# Patient Record
Sex: Male | Born: 1935 | Race: White | Hispanic: No | Marital: Married
Health system: Southern US, Community
[De-identification: ages and names within clinical notes are randomized; demographics above are authoritative.]

---

## 1998-12-21 ENCOUNTER — Encounter: Payer: Self-pay | Admitting: *Deleted

## 1998-12-21 ENCOUNTER — Emergency Department (HOSPITAL_COMMUNITY): Admission: EM | Admit: 1998-12-21 | Discharge: 1998-12-21 | Payer: Self-pay | Admitting: Emergency Medicine

## 2001-09-06 ENCOUNTER — Ambulatory Visit (HOSPITAL_COMMUNITY): Admission: RE | Admit: 2001-09-06 | Discharge: 2001-09-06 | Payer: Self-pay | Admitting: Gastroenterology

## 2002-12-12 ENCOUNTER — Ambulatory Visit (HOSPITAL_COMMUNITY): Admission: RE | Admit: 2002-12-12 | Discharge: 2002-12-13 | Payer: Self-pay | Admitting: Interventional Cardiology

## 2002-12-15 ENCOUNTER — Encounter: Payer: Self-pay | Admitting: Emergency Medicine

## 2002-12-15 ENCOUNTER — Observation Stay (HOSPITAL_COMMUNITY): Admission: EM | Admit: 2002-12-15 | Discharge: 2002-12-16 | Payer: Self-pay | Admitting: Emergency Medicine

## 2004-01-01 ENCOUNTER — Encounter: Admission: RE | Admit: 2004-01-01 | Discharge: 2004-01-01 | Payer: Self-pay | Admitting: Rheumatology

## 2004-02-06 ENCOUNTER — Ambulatory Visit (HOSPITAL_COMMUNITY): Admission: RE | Admit: 2004-02-06 | Discharge: 2004-02-06 | Payer: Self-pay | Admitting: Urology

## 2004-02-10 ENCOUNTER — Ambulatory Visit: Admission: RE | Admit: 2004-02-10 | Discharge: 2004-05-13 | Payer: Self-pay | Admitting: Radiation Oncology

## 2004-06-11 ENCOUNTER — Ambulatory Visit: Admission: RE | Admit: 2004-06-11 | Discharge: 2004-06-11 | Payer: Self-pay | Admitting: Radiation Oncology

## 2005-02-01 ENCOUNTER — Encounter: Admission: RE | Admit: 2005-02-01 | Discharge: 2005-02-01 | Payer: Self-pay | Admitting: Nephrology

## 2005-03-25 ENCOUNTER — Encounter: Admission: RE | Admit: 2005-03-25 | Discharge: 2005-03-25 | Payer: Self-pay | Admitting: Nephrology

## 2006-04-20 ENCOUNTER — Encounter: Admission: RE | Admit: 2006-04-20 | Discharge: 2006-04-20 | Payer: Self-pay | Admitting: Nephrology

## 2007-02-20 ENCOUNTER — Encounter: Admission: RE | Admit: 2007-02-20 | Discharge: 2007-02-20 | Payer: Self-pay | Admitting: Gastroenterology

## 2008-04-22 ENCOUNTER — Encounter: Admission: RE | Admit: 2008-04-22 | Discharge: 2008-04-22 | Payer: Self-pay | Admitting: Gastroenterology

## 2008-05-15 ENCOUNTER — Inpatient Hospital Stay (HOSPITAL_COMMUNITY): Admission: RE | Admit: 2008-05-15 | Discharge: 2008-05-21 | Payer: Self-pay | Admitting: Surgery

## 2008-05-15 ENCOUNTER — Encounter (INDEPENDENT_AMBULATORY_CARE_PROVIDER_SITE_OTHER): Payer: Self-pay | Admitting: Surgery

## 2008-05-22 ENCOUNTER — Ambulatory Visit: Payer: Self-pay | Admitting: Oncology

## 2008-06-04 ENCOUNTER — Ambulatory Visit (HOSPITAL_COMMUNITY): Admission: RE | Admit: 2008-06-04 | Discharge: 2008-06-04 | Payer: Self-pay | Admitting: Surgery

## 2008-06-17 LAB — COMPREHENSIVE METABOLIC PANEL
Albumin: 4.2 g/dL (ref 3.5–5.2)
Alkaline Phosphatase: 78 U/L (ref 39–117)
CO2: 28 mEq/L (ref 19–32)
Glucose, Bld: 132 mg/dL — ABNORMAL HIGH (ref 70–99)
Potassium: 3.5 mEq/L (ref 3.5–5.3)
Sodium: 142 mEq/L (ref 135–145)
Total Protein: 6.8 g/dL (ref 6.0–8.3)

## 2008-06-17 LAB — CBC WITH DIFFERENTIAL/PLATELET
Eosinophils Absolute: 0.1 10*3/uL (ref 0.0–0.5)
MONO#: 0.4 10*3/uL (ref 0.1–0.9)
NEUT#: 4.1 10*3/uL (ref 1.5–6.5)
RBC: 4.75 10*6/uL (ref 4.20–5.82)
RDW: 15.8 % — ABNORMAL HIGH (ref 11.0–14.6)
WBC: 5.8 10*3/uL (ref 4.0–10.3)

## 2008-07-02 LAB — CBC WITH DIFFERENTIAL/PLATELET
Basophils Absolute: 0.1 10*3/uL (ref 0.0–0.1)
EOS%: 6.9 % (ref 0.0–7.0)
Eosinophils Absolute: 0.4 10*3/uL (ref 0.0–0.5)
HCT: 40.4 % (ref 38.4–49.9)
HGB: 13.9 g/dL (ref 13.0–17.1)
LYMPH%: 28.1 % (ref 14.0–49.0)
MCH: 27.9 pg (ref 27.2–33.4)
MCV: 81.1 fL (ref 79.3–98.0)
MONO%: 9.5 % (ref 0.0–14.0)
NEUT#: 2.8 10*3/uL (ref 1.5–6.5)
NEUT%: 54.5 % (ref 39.0–75.0)
Platelets: 132 10*3/uL — ABNORMAL LOW (ref 140–400)
RDW: 15.1 % — ABNORMAL HIGH (ref 11.0–14.6)

## 2008-07-02 LAB — COMPREHENSIVE METABOLIC PANEL
Albumin: 3.9 g/dL (ref 3.5–5.2)
Alkaline Phosphatase: 69 U/L (ref 39–117)
BUN: 14 mg/dL (ref 6–23)
CO2: 29 mEq/L (ref 19–32)
Glucose, Bld: 104 mg/dL — ABNORMAL HIGH (ref 70–99)
Sodium: 140 mEq/L (ref 135–145)
Total Bilirubin: 1 mg/dL (ref 0.3–1.2)
Total Protein: 6.8 g/dL (ref 6.0–8.3)

## 2008-07-11 ENCOUNTER — Ambulatory Visit: Payer: Self-pay | Admitting: Oncology

## 2008-07-15 LAB — CBC WITH DIFFERENTIAL/PLATELET
Basophils Absolute: 0 10*3/uL (ref 0.0–0.1)
Eosinophils Absolute: 0.2 10*3/uL (ref 0.0–0.5)
HGB: 14.4 g/dL (ref 13.0–17.1)
MONO#: 0.5 10*3/uL (ref 0.1–0.9)
NEUT#: 3.2 10*3/uL (ref 1.5–6.5)
Platelets: 118 10*3/uL — ABNORMAL LOW (ref 140–400)
RBC: 5.07 10*6/uL (ref 4.20–5.82)
RDW: 16.1 % — ABNORMAL HIGH (ref 11.0–14.6)
WBC: 5.4 10*3/uL (ref 4.0–10.3)
nRBC: 0 % (ref 0–0)

## 2008-07-15 LAB — COMPREHENSIVE METABOLIC PANEL
ALT: 24 U/L (ref 0–53)
Albumin: 3.7 g/dL (ref 3.5–5.2)
CO2: 27 mEq/L (ref 19–32)
Calcium: 9.1 mg/dL (ref 8.4–10.5)
Chloride: 104 mEq/L (ref 96–112)
Glucose, Bld: 128 mg/dL — ABNORMAL HIGH (ref 70–99)
Sodium: 141 mEq/L (ref 135–145)
Total Protein: 6.8 g/dL (ref 6.0–8.3)

## 2008-07-29 LAB — CBC WITH DIFFERENTIAL/PLATELET
Basophils Absolute: 0 10*3/uL (ref 0.0–0.1)
EOS%: 1.7 % (ref 0.0–7.0)
HGB: 13.2 g/dL (ref 13.0–17.1)
MCH: 29.1 pg (ref 27.2–33.4)
MCHC: 35.2 g/dL (ref 32.0–36.0)
MCV: 82.8 fL (ref 79.3–98.0)
MONO%: 9.9 % (ref 0.0–14.0)
RDW: 17.5 % — ABNORMAL HIGH (ref 11.0–14.6)

## 2008-07-29 LAB — COMPREHENSIVE METABOLIC PANEL
ALT: 19 U/L (ref 0–53)
AST: 24 U/L (ref 0–37)
Albumin: 3.7 g/dL (ref 3.5–5.2)
Alkaline Phosphatase: 68 U/L (ref 39–117)
BUN: 19 mg/dL (ref 6–23)
Potassium: 3.1 mEq/L — ABNORMAL LOW (ref 3.5–5.3)
Sodium: 140 mEq/L (ref 135–145)
Total Protein: 6.6 g/dL (ref 6.0–8.3)

## 2008-08-12 LAB — CBC WITH DIFFERENTIAL/PLATELET
BASO%: 1 % (ref 0.0–2.0)
Basophils Absolute: 0.1 10e3/uL (ref 0.0–0.1)
EOS%: 4.1 % (ref 0.0–7.0)
Eosinophils Absolute: 0.2 10e3/uL (ref 0.0–0.5)
HCT: 38.9 % (ref 38.4–49.9)
HGB: 13.7 g/dL (ref 13.0–17.1)
LYMPH%: 25.3 % (ref 14.0–49.0)
MCH: 29.8 pg (ref 27.2–33.4)
MCHC: 35.2 g/dL (ref 32.0–36.0)
MCV: 84.6 fL (ref 79.3–98.0)
MONO#: 0.5 10e3/uL (ref 0.1–0.9)
MONO%: 7.7 % (ref 0.0–14.0)
NEUT#: 3.6 10e3/uL (ref 1.5–6.5)
NEUT%: 61.9 % (ref 39.0–75.0)
Platelets: 137 10e3/uL — ABNORMAL LOW (ref 140–400)
RBC: 4.6 10e6/uL (ref 4.20–5.82)
RDW: 18.2 % — ABNORMAL HIGH (ref 11.0–14.6)
WBC: 5.9 10e3/uL (ref 4.0–10.3)
lymph#: 1.5 10e3/uL (ref 0.9–3.3)

## 2008-08-22 ENCOUNTER — Ambulatory Visit: Payer: Self-pay | Admitting: Oncology

## 2008-08-26 LAB — CBC WITH DIFFERENTIAL/PLATELET
BASO%: 0.2 % (ref 0.0–2.0)
EOS%: 0.4 % (ref 0.0–7.0)
MCH: 29.9 pg (ref 27.2–33.4)
MCHC: 35.2 g/dL (ref 32.0–36.0)
MONO#: 0.6 10*3/uL (ref 0.1–0.9)
RBC: 4.52 10*6/uL (ref 4.20–5.82)
RDW: 18 % — ABNORMAL HIGH (ref 11.0–14.6)
WBC: 9.6 10*3/uL (ref 4.0–10.3)
lymph#: 1.6 10*3/uL (ref 0.9–3.3)
nRBC: 0 % (ref 0–0)

## 2008-09-17 LAB — CBC WITH DIFFERENTIAL/PLATELET
Basophils Absolute: 0 10*3/uL (ref 0.0–0.1)
Eosinophils Absolute: 0.2 10*3/uL (ref 0.0–0.5)
HCT: 38.9 % (ref 38.4–49.9)
HGB: 13.5 g/dL (ref 13.0–17.1)
MONO#: 0.4 10*3/uL (ref 0.1–0.9)
NEUT#: 2.9 10*3/uL (ref 1.5–6.5)
NEUT%: 60.2 % (ref 39.0–75.0)
WBC: 4.8 10*3/uL (ref 4.0–10.3)
lymph#: 1.3 10*3/uL (ref 0.9–3.3)

## 2008-09-17 LAB — COMPREHENSIVE METABOLIC PANEL
ALT: 17 U/L (ref 0–53)
Albumin: 3.8 g/dL (ref 3.5–5.2)
BUN: 26 mg/dL — ABNORMAL HIGH (ref 6–23)
CO2: 25 mEq/L (ref 19–32)
Calcium: 8.8 mg/dL (ref 8.4–10.5)
Chloride: 104 mEq/L (ref 96–112)
Creatinine, Ser: 1.36 mg/dL (ref 0.40–1.50)

## 2008-09-30 LAB — BASIC METABOLIC PANEL
CO2: 23 mEq/L (ref 19–32)
Chloride: 105 mEq/L (ref 96–112)
Creatinine, Ser: 1.34 mg/dL (ref 0.40–1.50)
Potassium: 3.9 mEq/L (ref 3.5–5.3)

## 2008-09-30 LAB — CBC WITH DIFFERENTIAL/PLATELET
BASO%: 0.1 % (ref 0.0–2.0)
HCT: 38.6 % (ref 38.4–49.9)
HGB: 13.5 g/dL (ref 13.0–17.1)
MCHC: 35 g/dL (ref 32.0–36.0)
MONO#: 0.5 10*3/uL (ref 0.1–0.9)
NEUT%: 62.8 % (ref 39.0–75.0)
RDW: 18.2 % — ABNORMAL HIGH (ref 11.0–14.6)
WBC: 6.9 10*3/uL (ref 4.0–10.3)
lymph#: 2 10*3/uL (ref 0.9–3.3)

## 2008-10-10 ENCOUNTER — Ambulatory Visit: Payer: Self-pay | Admitting: Oncology

## 2008-10-14 LAB — CBC WITH DIFFERENTIAL/PLATELET
Basophils Absolute: 0 10*3/uL (ref 0.0–0.1)
EOS%: 1.2 % (ref 0.0–7.0)
Eosinophils Absolute: 0.1 10*3/uL (ref 0.0–0.5)
HCT: 40.4 % (ref 38.4–49.9)
HGB: 14 g/dL (ref 13.0–17.1)
LYMPH%: 24.7 % (ref 14.0–49.0)
MCH: 31.3 pg (ref 27.2–33.4)
MCV: 90.4 fL (ref 79.3–98.0)
MONO%: 7.6 % (ref 0.0–14.0)
NEUT#: 5.1 10*3/uL (ref 1.5–6.5)
NEUT%: 66.1 % (ref 39.0–75.0)
Platelets: 123 10*3/uL — ABNORMAL LOW (ref 140–400)
RDW: 18.1 % — ABNORMAL HIGH (ref 11.0–14.6)

## 2008-10-28 LAB — CBC WITH DIFFERENTIAL/PLATELET
Basophils Absolute: 0 10*3/uL (ref 0.0–0.1)
Eosinophils Absolute: 0.1 10*3/uL (ref 0.0–0.5)
HCT: 39.4 % (ref 38.4–49.9)
HGB: 13.4 g/dL (ref 13.0–17.1)
MCH: 31.9 pg (ref 27.2–33.4)
MCV: 93.6 fL (ref 79.3–98.0)
MONO%: 6.2 % (ref 0.0–14.0)
NEUT#: 3.6 10*3/uL (ref 1.5–6.5)
NEUT%: 65.4 % (ref 39.0–75.0)
RDW: 18 % — ABNORMAL HIGH (ref 11.0–14.6)

## 2008-10-28 LAB — COMPREHENSIVE METABOLIC PANEL
Albumin: 4.2 g/dL (ref 3.5–5.2)
Alkaline Phosphatase: 54 U/L (ref 39–117)
BUN: 16 mg/dL (ref 6–23)
Calcium: 9.4 mg/dL (ref 8.4–10.5)
Chloride: 105 mEq/L (ref 96–112)
Creatinine, Ser: 1.36 mg/dL (ref 0.40–1.50)
Glucose, Bld: 111 mg/dL — ABNORMAL HIGH (ref 70–99)
Potassium: 4.2 mEq/L (ref 3.5–5.3)

## 2008-11-08 ENCOUNTER — Ambulatory Visit: Payer: Self-pay | Admitting: Oncology

## 2008-11-11 LAB — CBC WITH DIFFERENTIAL/PLATELET
Basophils Absolute: 0 10*3/uL (ref 0.0–0.1)
Eosinophils Absolute: 0.2 10*3/uL (ref 0.0–0.5)
HCT: 41.7 % (ref 38.4–49.9)
HGB: 14.4 g/dL (ref 13.0–17.1)
NEUT#: 4.4 10*3/uL (ref 1.5–6.5)
NEUT%: 62.1 % (ref 39.0–75.0)
RDW: 16.8 % — ABNORMAL HIGH (ref 11.0–14.6)
lymph#: 2 10*3/uL (ref 0.9–3.3)

## 2008-11-25 LAB — URINALYSIS, MICROSCOPIC - CHCC
Bilirubin (Urine): NEGATIVE
Blood: NEGATIVE
Ketones: NEGATIVE mg/dL
Protein: NEGATIVE mg/dL
Specific Gravity, Urine: 1.01 (ref 1.003–1.035)
pH: 6 (ref 4.6–8.0)

## 2008-11-25 LAB — COMPREHENSIVE METABOLIC PANEL
AST: 21 U/L (ref 0–37)
Albumin: 4 g/dL (ref 3.5–5.2)
Alkaline Phosphatase: 67 U/L (ref 39–117)
BUN: 14 mg/dL (ref 6–23)
Creatinine, Ser: 1.29 mg/dL (ref 0.40–1.50)
Glucose, Bld: 123 mg/dL — ABNORMAL HIGH (ref 70–99)
Potassium: 4.2 mEq/L (ref 3.5–5.3)
Total Bilirubin: 0.7 mg/dL (ref 0.3–1.2)

## 2008-11-25 LAB — CBC WITH DIFFERENTIAL/PLATELET
Basophils Absolute: 0.1 10*3/uL (ref 0.0–0.1)
EOS%: 2.5 % (ref 0.0–7.0)
Eosinophils Absolute: 0.2 10*3/uL (ref 0.0–0.5)
HGB: 14.2 g/dL (ref 13.0–17.1)
LYMPH%: 18.2 % (ref 14.0–49.0)
MCH: 31 pg (ref 27.2–33.4)
MCV: 91.3 fL (ref 79.3–98.0)
MONO%: 6.3 % (ref 0.0–14.0)
NEUT#: 5.8 10*3/uL (ref 1.5–6.5)
Platelets: 140 10*3/uL (ref 140–400)
RDW: 16.1 % — ABNORMAL HIGH (ref 11.0–14.6)

## 2009-01-02 ENCOUNTER — Ambulatory Visit: Payer: Self-pay | Admitting: Oncology

## 2009-01-23 ENCOUNTER — Encounter: Admission: RE | Admit: 2009-01-23 | Discharge: 2009-01-23 | Payer: Self-pay | Admitting: Gastroenterology

## 2009-01-24 LAB — COMPREHENSIVE METABOLIC PANEL
ALT: 29 U/L (ref 0–53)
BUN: 10 mg/dL (ref 6–23)
CO2: 23 mEq/L (ref 19–32)
Calcium: 9.5 mg/dL (ref 8.4–10.5)
Chloride: 105 mEq/L (ref 96–112)
Creatinine, Ser: 1.12 mg/dL (ref 0.40–1.50)
Glucose, Bld: 102 mg/dL — ABNORMAL HIGH (ref 70–99)
Total Bilirubin: 0.5 mg/dL (ref 0.3–1.2)

## 2009-01-24 LAB — CEA: CEA: 1 ng/mL (ref 0.0–5.0)

## 2009-01-24 LAB — CBC WITH DIFFERENTIAL/PLATELET
BASO%: 0.2 % (ref 0.0–2.0)
Basophils Absolute: 0 10*3/uL (ref 0.0–0.1)
Eosinophils Absolute: 0.1 10*3/uL (ref 0.0–0.5)
HCT: 38.1 % — ABNORMAL LOW (ref 38.4–49.9)
HGB: 13 g/dL (ref 13.0–17.1)
LYMPH%: 14.8 % (ref 14.0–49.0)
MCHC: 34.2 g/dL (ref 32.0–36.0)
MONO#: 0.5 10*3/uL (ref 0.1–0.9)
NEUT%: 76 % — ABNORMAL HIGH (ref 39.0–75.0)
Platelets: 226 10*3/uL (ref 140–400)
WBC: 6.9 10*3/uL (ref 4.0–10.3)
lymph#: 1 10*3/uL (ref 0.9–3.3)

## 2009-02-04 ENCOUNTER — Ambulatory Visit: Payer: Self-pay | Admitting: Oncology

## 2009-02-06 LAB — CBC WITH DIFFERENTIAL/PLATELET
BASO%: 0.6 % (ref 0.0–2.0)
Basophils Absolute: 0.1 10*3/uL (ref 0.0–0.1)
EOS%: 2.1 % (ref 0.0–7.0)
HCT: 39.2 % (ref 38.4–49.9)
HGB: 12.7 g/dL — ABNORMAL LOW (ref 13.0–17.1)
MCH: 28.1 pg (ref 27.2–33.4)
MONO#: 0.7 10*3/uL (ref 0.1–0.9)
NEUT#: 5.8 10*3/uL (ref 1.5–6.5)
NEUT%: 72.3 % (ref 39.0–75.0)
RDW: 14.7 % — ABNORMAL HIGH (ref 11.0–14.6)
WBC: 8.1 10*3/uL (ref 4.0–10.3)
lymph#: 1.3 10*3/uL (ref 0.9–3.3)

## 2009-02-19 LAB — COMPREHENSIVE METABOLIC PANEL
ALT: 14 U/L (ref 0–53)
BUN: 13 mg/dL (ref 6–23)
CO2: 23 mEq/L (ref 19–32)
Calcium: 9 mg/dL (ref 8.4–10.5)
Chloride: 104 mEq/L (ref 96–112)
Creatinine, Ser: 1.25 mg/dL (ref 0.40–1.50)
Total Bilirubin: 0.6 mg/dL (ref 0.3–1.2)

## 2009-02-19 LAB — CBC WITH DIFFERENTIAL/PLATELET
BASO%: 0.4 % (ref 0.0–2.0)
Eosinophils Absolute: 0.3 10*3/uL (ref 0.0–0.5)
HCT: 37.8 % — ABNORMAL LOW (ref 38.4–49.9)
LYMPH%: 13.4 % — ABNORMAL LOW (ref 14.0–49.0)
MCHC: 33 g/dL (ref 32.0–36.0)
MCV: 86.3 fL (ref 79.3–98.0)
MONO%: 7.3 % (ref 0.0–14.0)
NEUT%: 75.7 % — ABNORMAL HIGH (ref 39.0–75.0)
Platelets: 243 10*3/uL (ref 140–400)
RBC: 4.38 10*6/uL (ref 4.20–5.82)

## 2009-02-19 LAB — CEA: CEA: 0.9 ng/mL (ref 0.0–5.0)

## 2009-05-22 ENCOUNTER — Ambulatory Visit: Payer: Self-pay | Admitting: Internal Medicine

## 2009-05-23 ENCOUNTER — Ambulatory Visit: Payer: Self-pay | Admitting: Oncology

## 2009-05-23 ENCOUNTER — Inpatient Hospital Stay (HOSPITAL_COMMUNITY): Admission: EM | Admit: 2009-05-23 | Discharge: 2009-06-06 | Payer: Self-pay | Admitting: Emergency Medicine

## 2009-06-16 ENCOUNTER — Ambulatory Visit: Payer: Self-pay | Admitting: Pulmonary Disease

## 2009-06-16 ENCOUNTER — Inpatient Hospital Stay (HOSPITAL_COMMUNITY): Admission: EM | Admit: 2009-06-16 | Discharge: 2009-06-25 | Payer: Self-pay | Admitting: Emergency Medicine

## 2009-07-18 DEATH — deceased

## 2010-05-10 ENCOUNTER — Encounter: Payer: Self-pay | Admitting: Oncology

## 2010-07-09 LAB — COMPREHENSIVE METABOLIC PANEL
ALT: 15 U/L (ref 0–53)
ALT: 18 U/L (ref 0–53)
ALT: 24 U/L (ref 0–53)
ALT: 33 U/L (ref 0–53)
AST: 25 U/L (ref 0–37)
AST: 25 U/L (ref 0–37)
Albumin: 2.2 g/dL — ABNORMAL LOW (ref 3.5–5.2)
Albumin: 3.2 g/dL — ABNORMAL LOW (ref 3.5–5.2)
Alkaline Phosphatase: 113 U/L (ref 39–117)
Alkaline Phosphatase: 130 U/L — ABNORMAL HIGH (ref 39–117)
Alkaline Phosphatase: 150 U/L — ABNORMAL HIGH (ref 39–117)
Alkaline Phosphatase: 97 U/L (ref 39–117)
BUN: 18 mg/dL (ref 6–23)
BUN: 33 mg/dL — ABNORMAL HIGH (ref 6–23)
BUN: 57 mg/dL — ABNORMAL HIGH (ref 6–23)
CO2: 20 mEq/L (ref 19–32)
CO2: 24 mEq/L (ref 19–32)
Calcium: 11 mg/dL — ABNORMAL HIGH (ref 8.4–10.5)
Calcium: 8.6 mg/dL (ref 8.4–10.5)
Calcium: 9.4 mg/dL (ref 8.4–10.5)
Chloride: 109 mEq/L (ref 96–112)
Chloride: 120 mEq/L — ABNORMAL HIGH (ref 96–112)
Creatinine, Ser: 1.72 mg/dL — ABNORMAL HIGH (ref 0.4–1.5)
GFR calc Af Amer: 47 mL/min — ABNORMAL LOW (ref >60)
GFR calc Af Amer: >60 mL/min (ref >60)
GFR calc non Af Amer: 55 mL/min — ABNORMAL LOW (ref >60)
GFR calc non Af Amer: 58 mL/min — ABNORMAL LOW (ref >60)
Glucose, Bld: 115 mg/dL — ABNORMAL HIGH (ref 70–99)
Glucose, Bld: 122 mg/dL — ABNORMAL HIGH (ref 70–99)
Glucose, Bld: 127 mg/dL — ABNORMAL HIGH (ref 70–99)
Potassium: 3.5 mEq/L (ref 3.5–5.1)
Potassium: 3.9 mEq/L (ref 3.5–5.1)
Potassium: 4.1 mEq/L (ref 3.5–5.1)
Sodium: 129 mEq/L — ABNORMAL LOW (ref 135–145)
Sodium: 138 mEq/L (ref 135–145)
Sodium: 143 mEq/L (ref 135–145)
Total Bilirubin: 0.2 mg/dL — ABNORMAL LOW (ref 0.3–1.2)
Total Bilirubin: 0.8 mg/dL (ref 0.3–1.2)
Total Protein: 5.7 g/dL — ABNORMAL LOW (ref 6.0–8.3)
Total Protein: 6.5 g/dL (ref 6.0–8.3)
Total Protein: 6.8 g/dL (ref 6.0–8.3)
Total Protein: 8 g/dL (ref 6.0–8.3)

## 2010-07-09 LAB — CBC
HCT: 24.9 % — ABNORMAL LOW (ref 39.0–52.0)
HCT: 25.6 % — ABNORMAL LOW (ref 39.0–52.0)
HCT: 26 % — ABNORMAL LOW (ref 39.0–52.0)
HCT: 27.3 % — ABNORMAL LOW (ref 39.0–52.0)
HCT: 27.5 % — ABNORMAL LOW (ref 39.0–52.0)
HCT: 27.7 % — ABNORMAL LOW (ref 39.0–52.0)
HCT: 28.8 % — ABNORMAL LOW (ref 39.0–52.0)
HCT: 31.6 % — ABNORMAL LOW (ref 39.0–52.0)
HCT: 48 % (ref 39.0–52.0)
Hemoglobin: 10.4 g/dL — ABNORMAL LOW (ref 13.0–17.0)
Hemoglobin: 8.6 g/dL — ABNORMAL LOW (ref 13.0–17.0)
Hemoglobin: 9.1 g/dL — ABNORMAL LOW (ref 13.0–17.0)
Hemoglobin: 9.4 g/dL — ABNORMAL LOW (ref 13.0–17.0)
MCHC: 31.7 g/dL (ref 30.0–36.0)
MCHC: 33 g/dL (ref 30.0–36.0)
MCHC: 33.4 g/dL (ref 30.0–36.0)
MCHC: 33.5 g/dL (ref 30.0–36.0)
MCHC: 33.5 g/dL (ref 30.0–36.0)
MCHC: 33.6 g/dL (ref 30.0–36.0)
MCHC: 33.6 g/dL (ref 30.0–36.0)
MCV: 79.2 fL (ref 78.0–100.0)
MCV: 80 fL (ref 78.0–100.0)
MCV: 80.1 fL (ref 78.0–100.0)
MCV: 80.2 fL (ref 78.0–100.0)
MCV: 80.2 fL (ref 78.0–100.0)
MCV: 80.4 fL (ref 78.0–100.0)
MCV: 81.1 fL (ref 78.0–100.0)
Platelets: 158 10*3/uL (ref 150–400)
Platelets: 216 10*3/uL (ref 150–400)
Platelets: 237 10*3/uL (ref 150–400)
Platelets: 245 10*3/uL (ref 150–400)
Platelets: 267 10*3/uL (ref 150–400)
Platelets: 276 10*3/uL (ref 150–400)
Platelets: 309 10*3/uL (ref 150–400)
Platelets: 319 10*3/uL (ref 150–400)
Platelets: 560 K/uL — ABNORMAL HIGH (ref 150–400)
RBC: 3.15 MIL/uL — ABNORMAL LOW (ref 4.22–5.81)
RBC: 3.3 MIL/uL — ABNORMAL LOW (ref 4.22–5.81)
RBC: 3.34 MIL/uL — ABNORMAL LOW (ref 4.22–5.81)
RBC: 3.43 MIL/uL — ABNORMAL LOW (ref 4.22–5.81)
RBC: 3.43 MIL/uL — ABNORMAL LOW (ref 4.22–5.81)
RBC: 3.53 MIL/uL — ABNORMAL LOW (ref 4.22–5.81)
RBC: 3.63 MIL/uL — ABNORMAL LOW (ref 4.22–5.81)
RBC: 3.69 MIL/uL — ABNORMAL LOW (ref 4.22–5.81)
RDW: 17.4 % — ABNORMAL HIGH (ref 11.5–15.5)
RDW: 17.4 % — ABNORMAL HIGH (ref 11.5–15.5)
RDW: 17.5 % — ABNORMAL HIGH (ref 11.5–15.5)
RDW: 17.6 % — ABNORMAL HIGH (ref 11.5–15.5)
RDW: 17.8 % — ABNORMAL HIGH (ref 11.5–15.5)
RDW: 17.9 % — ABNORMAL HIGH (ref 11.5–15.5)
RDW: 18 % — ABNORMAL HIGH (ref 11.5–15.5)
RDW: 18.2 % — ABNORMAL HIGH (ref 11.5–15.5)
RDW: 18.2 % — ABNORMAL HIGH (ref 11.5–15.5)
RDW: 18.2 % — ABNORMAL HIGH (ref 11.5–15.5)
RDW: 18.7 % — ABNORMAL HIGH (ref 11.5–15.5)
WBC: 10.6 10*3/uL — ABNORMAL HIGH (ref 4.0–10.5)
WBC: 11.3 10*3/uL — ABNORMAL HIGH (ref 4.0–10.5)
WBC: 12.7 10*3/uL — ABNORMAL HIGH (ref 4.0–10.5)
WBC: 15.4 10*3/uL — ABNORMAL HIGH (ref 4.0–10.5)
WBC: 16.6 10*3/uL — ABNORMAL HIGH (ref 4.0–10.5)
WBC: 26.6 K/uL — ABNORMAL HIGH (ref 4.0–10.5)
WBC: 7.4 10*3/uL (ref 4.0–10.5)
WBC: 7.6 10*3/uL (ref 4.0–10.5)
WBC: 9 10*3/uL (ref 4.0–10.5)
WBC: 9.8 10*3/uL (ref 4.0–10.5)

## 2010-07-09 LAB — BASIC METABOLIC PANEL
BUN: 13 mg/dL (ref 6–23)
BUN: 13 mg/dL (ref 6–23)
BUN: 15 mg/dL (ref 6–23)
BUN: 17 mg/dL (ref 6–23)
BUN: 18 mg/dL (ref 6–23)
BUN: 24 mg/dL — ABNORMAL HIGH (ref 6–23)
BUN: 26 mg/dL — ABNORMAL HIGH (ref 6–23)
BUN: 28 mg/dL — ABNORMAL HIGH (ref 6–23)
BUN: 28 mg/dL — ABNORMAL HIGH (ref 6–23)
BUN: 38 mg/dL — ABNORMAL HIGH (ref 6–23)
CO2: 17 mEq/L — ABNORMAL LOW (ref 19–32)
CO2: 20 mEq/L (ref 19–32)
CO2: 21 mEq/L (ref 19–32)
Calcium: 8.6 mg/dL (ref 8.4–10.5)
Calcium: 8.8 mg/dL (ref 8.4–10.5)
Calcium: 9 mg/dL (ref 8.4–10.5)
Calcium: 9.1 mg/dL (ref 8.4–10.5)
Chloride: 104 mEq/L (ref 96–112)
Chloride: 105 mEq/L (ref 96–112)
Chloride: 105 mEq/L (ref 96–112)
Chloride: 106 mEq/L (ref 96–112)
Chloride: 108 mEq/L (ref 96–112)
Chloride: 114 mEq/L — ABNORMAL HIGH (ref 96–112)
Creatinine, Ser: 0.84 mg/dL (ref 0.4–1.5)
Creatinine, Ser: 0.98 mg/dL (ref 0.4–1.5)
Creatinine, Ser: 0.99 mg/dL (ref 0.4–1.5)
Creatinine, Ser: 0.99 mg/dL (ref 0.4–1.5)
Creatinine, Ser: 1.02 mg/dL (ref 0.4–1.5)
Creatinine, Ser: 1.04 mg/dL (ref 0.4–1.5)
Creatinine, Ser: 1.31 mg/dL (ref 0.4–1.5)
GFR calc Af Amer: >60 mL/min (ref >60)
GFR calc Af Amer: >60 mL/min (ref >60)
GFR calc Af Amer: >60 mL/min (ref >60)
GFR calc Af Amer: >60 mL/min (ref >60)
GFR calc non Af Amer: 52 mL/min — ABNORMAL LOW (ref >60)
GFR calc non Af Amer: >60 mL/min (ref >60)
GFR calc non Af Amer: >60 mL/min (ref >60)
GFR calc non Af Amer: >60 mL/min (ref >60)
GFR calc non Af Amer: >60 mL/min (ref >60)
GFR calc non Af Amer: >60 mL/min (ref >60)
GFR calc non Af Amer: >60 mL/min (ref >60)
GFR calc non Af Amer: >60 mL/min (ref >60)
GFR calc non Af Amer: >60 mL/min (ref >60)
GFR calc non Af Amer: >60 mL/min (ref >60)
Glucose, Bld: 110 mg/dL — ABNORMAL HIGH (ref 70–99)
Glucose, Bld: 120 mg/dL — ABNORMAL HIGH (ref 70–99)
Glucose, Bld: 120 mg/dL — ABNORMAL HIGH (ref 70–99)
Glucose, Bld: 138 mg/dL — ABNORMAL HIGH (ref 70–99)
Glucose, Bld: 142 mg/dL — ABNORMAL HIGH (ref 70–99)
Glucose, Bld: 97 mg/dL (ref 70–99)
Potassium: 3.4 mEq/L — ABNORMAL LOW (ref 3.5–5.1)
Potassium: 3.5 mEq/L (ref 3.5–5.1)
Potassium: 3.9 mEq/L (ref 3.5–5.1)
Potassium: 4 mEq/L (ref 3.5–5.1)
Potassium: 4.1 mEq/L (ref 3.5–5.1)
Potassium: 4.2 mEq/L (ref 3.5–5.1)
Sodium: 135 mEq/L (ref 135–145)
Sodium: 137 mEq/L (ref 135–145)
Sodium: 140 mEq/L (ref 135–145)

## 2010-07-09 LAB — URINE MICROSCOPIC-ADD ON

## 2010-07-09 LAB — CARDIAC PANEL(CRET KIN+CKTOT+MB+TROPI)
CK, MB: 0.5 ng/mL (ref 0.3–4.0)
CK, MB: 0.8 ng/mL (ref 0.3–4.0)
CK, MB: 1 ng/mL (ref 0.3–4.0)
CK, MB: 2.8 ng/mL (ref 0.3–4.0)
Relative Index: INVALID (ref 0.0–2.5)
Relative Index: INVALID (ref 0.0–2.5)
Total CK: 9 U/L (ref 7–232)
Total CK: <7 U/L — ABNORMAL LOW (ref 7–232)
Total CK: <7 U/L — ABNORMAL LOW (ref 7–232)
Troponin I: 0.02 ng/mL (ref 0.00–0.06)
Troponin I: 0.02 ng/mL (ref 0.00–0.06)
Troponin I: 0.03 ng/mL (ref 0.00–0.06)
Troponin I: 0.34 ng/mL — ABNORMAL HIGH (ref 0.00–0.06)
Troponin I: 0.44 ng/mL — ABNORMAL HIGH (ref 0.00–0.06)

## 2010-07-09 LAB — CULTURE, BLOOD (ROUTINE X 2): Culture: NO GROWTH

## 2010-07-09 LAB — CROSSMATCH
ABO/RH(D): O NEG
Antibody Screen: NEGATIVE

## 2010-07-09 LAB — DIFFERENTIAL
Basophils Absolute: 0 10*3/uL (ref 0.0–0.1)
Basophils Absolute: 0 10*3/uL (ref 0.0–0.1)
Basophils Absolute: 0 K/uL (ref 0.0–0.1)
Basophils Relative: 0 % (ref 0–1)
Basophils Relative: 0 % (ref 0–1)
Basophils Relative: 0 % (ref 0–1)
Basophils Relative: 0 % (ref 0–1)
Eosinophils Absolute: 0 10*3/uL (ref 0.0–0.7)
Eosinophils Absolute: 0 10*3/uL (ref 0.0–0.7)
Eosinophils Absolute: 0.1 10*3/uL (ref 0.0–0.7)
Eosinophils Relative: 0 % (ref 0–5)
Eosinophils Relative: 0 % (ref 0–5)
Lymphocytes Relative: 4 % — ABNORMAL LOW (ref 12–46)
Lymphs Abs: 1.1 K/uL (ref 0.7–4.0)
Monocytes Absolute: 0.6 10*3/uL (ref 0.1–1.0)
Monocytes Absolute: 0.8 10*3/uL (ref 0.1–1.0)
Monocytes Relative: 2 % — ABNORMAL LOW (ref 3–12)
Monocytes Relative: 3 % (ref 3–12)
Neutro Abs: 24.7 K/uL — ABNORMAL HIGH (ref 1.7–7.7)
Neutro Abs: 7.8 10*3/uL — ABNORMAL HIGH (ref 1.7–7.7)
Neutrophils Relative %: 84 % — ABNORMAL HIGH (ref 43–77)
Neutrophils Relative %: 87 % — ABNORMAL HIGH (ref 43–77)
Neutrophils Relative %: 93 % — ABNORMAL HIGH (ref 43–77)
Neutrophils Relative %: 96 % — ABNORMAL HIGH (ref 43–77)

## 2010-07-09 LAB — URINALYSIS, ROUTINE W REFLEX MICROSCOPIC
Bilirubin Urine: NEGATIVE
Bilirubin Urine: NEGATIVE
Bilirubin Urine: NEGATIVE
Glucose, UA: NEGATIVE mg/dL
Glucose, UA: NEGATIVE mg/dL
Glucose, UA: NEGATIVE mg/dL
Hgb urine dipstick: NEGATIVE
Hgb urine dipstick: NEGATIVE
Ketones, ur: NEGATIVE mg/dL
Ketones, ur: NEGATIVE mg/dL
Nitrite: NEGATIVE
Nitrite: NEGATIVE
Nitrite: POSITIVE — AB
Protein, ur: NEGATIVE mg/dL
Protein, ur: NEGATIVE mg/dL
Specific Gravity, Urine: 1.009 (ref 1.005–1.030)
Specific Gravity, Urine: 1.012 (ref 1.005–1.030)
Specific Gravity, Urine: 1.016 (ref 1.005–1.030)
Urobilinogen, UA: 0.2 mg/dL (ref 0.0–1.0)
Urobilinogen, UA: 0.2 mg/dL (ref 0.0–1.0)
pH: 6.5 (ref 5.0–8.0)
pH: 6.5 (ref 5.0–8.0)
pH: 7 (ref 5.0–8.0)

## 2010-07-09 LAB — IRON AND TIBC
Iron: 15 ug/dL — ABNORMAL LOW (ref 42–135)
Saturation Ratios: 9 % — ABNORMAL LOW (ref 20–55)
TIBC: 168 ug/dL — ABNORMAL LOW (ref 215–435)
UIBC: 153 ug/dL

## 2010-07-09 LAB — COMPREHENSIVE METABOLIC PANEL WITH GFR
AST: 15 U/L (ref 0–37)
Albumin: 2.4 g/dL — ABNORMAL LOW (ref 3.5–5.2)
Chloride: 99 meq/L (ref 96–112)
Creatinine, Ser: 1.28 mg/dL (ref 0.4–1.5)
GFR calc Af Amer: >60 mL/min (ref >60)
Total Bilirubin: 0.4 mg/dL (ref 0.3–1.2)

## 2010-07-09 LAB — PROTIME-INR
INR: 1.11 (ref 0.00–1.49)
Prothrombin Time: 14.2 s (ref 11.6–15.2)

## 2010-07-09 LAB — LIPASE, BLOOD
Lipase: 106 U/L — ABNORMAL HIGH (ref 11–59)
Lipase: 171 U/L — ABNORMAL HIGH (ref 11–59)

## 2010-07-09 LAB — AMYLASE: Amylase: 99 U/L (ref 0–105)

## 2010-07-09 LAB — URINE CULTURE: Colony Count: >=100000

## 2010-07-09 LAB — GLUCOSE, CAPILLARY
Glucose-Capillary: 103 mg/dL — ABNORMAL HIGH (ref 70–99)
Glucose-Capillary: 110 mg/dL — ABNORMAL HIGH (ref 70–99)
Glucose-Capillary: 111 mg/dL — ABNORMAL HIGH (ref 70–99)
Glucose-Capillary: 114 mg/dL — ABNORMAL HIGH (ref 70–99)
Glucose-Capillary: 130 mg/dL — ABNORMAL HIGH (ref 70–99)
Glucose-Capillary: 137 mg/dL — ABNORMAL HIGH (ref 70–99)
Glucose-Capillary: 148 mg/dL — ABNORMAL HIGH (ref 70–99)
Glucose-Capillary: 149 mg/dL — ABNORMAL HIGH (ref 70–99)
Glucose-Capillary: 163 mg/dL — ABNORMAL HIGH (ref 70–99)
Glucose-Capillary: 95 mg/dL (ref 70–99)

## 2010-07-09 LAB — MRSA PCR SCREENING
MRSA by PCR: NEGATIVE
MRSA by PCR: NEGATIVE

## 2010-07-09 LAB — LACTIC ACID, PLASMA: Lactic Acid, Venous: 3.3 mmol/L — ABNORMAL HIGH (ref 0.5–2.2)

## 2010-07-09 LAB — APTT: aPTT: 31 s (ref 24–37)

## 2010-07-09 LAB — MAGNESIUM: Magnesium: 1.7 mg/dL (ref 1.5–2.5)

## 2010-07-09 LAB — NEOENCEPHALITIS PARANEOPLASTIC PROFIL

## 2010-07-09 LAB — VITAMIN B12: Vitamin B-12: 538 pg/mL (ref 211–911)

## 2010-07-09 LAB — POCT CARDIAC MARKERS: Myoglobin, poc: 103 ng/mL (ref 12–200)

## 2010-07-09 LAB — TECHNOLOGIST SMEAR REVIEW

## 2010-07-09 LAB — BRAIN NATRIURETIC PEPTIDE: Pro B Natriuretic peptide (BNP): 61.1 pg/mL (ref 0.0–100.0)

## 2010-07-09 LAB — T4, FREE: Free T4: 0.92 ng/dL (ref 0.80–1.80)

## 2010-07-09 LAB — ATHENA MAIL

## 2010-07-13 LAB — CROSSMATCH: ABO/RH(D): O NEG

## 2010-07-13 LAB — BASIC METABOLIC PANEL
BUN: 20 mg/dL (ref 6–23)
BUN: 21 mg/dL (ref 6–23)
BUN: 24 mg/dL — ABNORMAL HIGH (ref 6–23)
BUN: 27 mg/dL — ABNORMAL HIGH (ref 6–23)
CO2: 16 mEq/L — ABNORMAL LOW (ref 19–32)
CO2: 18 mEq/L — ABNORMAL LOW (ref 19–32)
CO2: 21 mEq/L (ref 19–32)
CO2: 21 mEq/L (ref 19–32)
CO2: 22 mEq/L (ref 19–32)
CO2: 22 mEq/L (ref 19–32)
Calcium: 7.5 mg/dL — ABNORMAL LOW (ref 8.4–10.5)
Calcium: 8.5 mg/dL (ref 8.4–10.5)
Chloride: 110 mEq/L (ref 96–112)
Chloride: 112 mEq/L (ref 96–112)
Chloride: 113 mEq/L — ABNORMAL HIGH (ref 96–112)
Chloride: 115 mEq/L — ABNORMAL HIGH (ref 96–112)
Chloride: 116 mEq/L — ABNORMAL HIGH (ref 96–112)
Creatinine, Ser: 1.16 mg/dL (ref 0.4–1.5)
Creatinine, Ser: 1.18 mg/dL (ref 0.4–1.5)
Creatinine, Ser: 1.26 mg/dL (ref 0.4–1.5)
Creatinine, Ser: 1.39 mg/dL (ref 0.4–1.5)
GFR calc Af Amer: >60 mL/min (ref >60)
GFR calc Af Amer: >60 mL/min (ref >60)
Glucose, Bld: 146 mg/dL — ABNORMAL HIGH (ref 70–99)
Glucose, Bld: 186 mg/dL — ABNORMAL HIGH (ref 70–99)
Glucose, Bld: 189 mg/dL — ABNORMAL HIGH (ref 70–99)
Potassium: 3.2 mEq/L — ABNORMAL LOW (ref 3.5–5.1)
Potassium: 3.2 mEq/L — ABNORMAL LOW (ref 3.5–5.1)
Sodium: 138 mEq/L (ref 135–145)
Sodium: 140 mEq/L (ref 135–145)
Sodium: 141 mEq/L (ref 135–145)

## 2010-07-13 LAB — CBC
HCT: 23.6 % — ABNORMAL LOW (ref 39.0–52.0)
HCT: 24.6 % — ABNORMAL LOW (ref 39.0–52.0)
HCT: 33.9 % — ABNORMAL LOW (ref 39.0–52.0)
HCT: 35.6 % — ABNORMAL LOW (ref 39.0–52.0)
Hemoglobin: 11.5 g/dL — ABNORMAL LOW (ref 13.0–17.0)
Hemoglobin: 7.7 g/dL — ABNORMAL LOW (ref 13.0–17.0)
Hemoglobin: 7.8 g/dL — ABNORMAL LOW (ref 13.0–17.0)
Hemoglobin: 8.8 g/dL — ABNORMAL LOW (ref 13.0–17.0)
MCHC: 31.4 g/dL (ref 30.0–36.0)
MCHC: 31.8 g/dL (ref 30.0–36.0)
MCHC: 32.8 g/dL (ref 30.0–36.0)
MCHC: 33.5 g/dL (ref 30.0–36.0)
MCHC: 33.6 g/dL (ref 30.0–36.0)
MCHC: 33.9 g/dL (ref 30.0–36.0)
MCHC: 34.1 g/dL (ref 30.0–36.0)
MCV: 80.5 fL (ref 78.0–100.0)
MCV: 80.5 fL (ref 78.0–100.0)
MCV: 80.9 fL (ref 78.0–100.0)
MCV: 80.9 fL (ref 78.0–100.0)
MCV: 81.8 fL (ref 78.0–100.0)
MCV: 82.9 fL (ref 78.0–100.0)
MCV: 84.3 fL (ref 78.0–100.0)
Platelets: 307 10*3/uL (ref 150–400)
Platelets: 323 10*3/uL (ref 150–400)
Platelets: 328 10*3/uL (ref 150–400)
Platelets: 361 10*3/uL (ref 150–400)
RBC: 2.94 MIL/uL — ABNORMAL LOW (ref 4.22–5.81)
RBC: 3.06 MIL/uL — ABNORMAL LOW (ref 4.22–5.81)
RBC: 3.14 MIL/uL — ABNORMAL LOW (ref 4.22–5.81)
RBC: 3.19 MIL/uL — ABNORMAL LOW (ref 4.22–5.81)
RBC: 3.91 MIL/uL — ABNORMAL LOW (ref 4.22–5.81)
RDW: 17.1 % — ABNORMAL HIGH (ref 11.5–15.5)
RDW: 17.6 % — ABNORMAL HIGH (ref 11.5–15.5)
RDW: 17.8 % — ABNORMAL HIGH (ref 11.5–15.5)
WBC: 10.7 10*3/uL — ABNORMAL HIGH (ref 4.0–10.5)
WBC: 10.9 10*3/uL — ABNORMAL HIGH (ref 4.0–10.5)
WBC: 10.9 10*3/uL — ABNORMAL HIGH (ref 4.0–10.5)
WBC: 12.1 10*3/uL — ABNORMAL HIGH (ref 4.0–10.5)
WBC: 23.1 10*3/uL — ABNORMAL HIGH (ref 4.0–10.5)

## 2010-07-13 LAB — DIFFERENTIAL
Basophils Relative: 0 % (ref 0–1)
Eosinophils Absolute: 0 10*3/uL (ref 0.0–0.7)
Monocytes Absolute: 1 10*3/uL (ref 0.1–1.0)
Neutro Abs: 30.1 10*3/uL — ABNORMAL HIGH (ref 1.7–7.7)
Neutrophils Relative %: 95 % — ABNORMAL HIGH (ref 43–77)

## 2010-07-13 LAB — URINALYSIS, ROUTINE W REFLEX MICROSCOPIC
Glucose, UA: NEGATIVE mg/dL
Hgb urine dipstick: NEGATIVE
Ketones, ur: NEGATIVE mg/dL
Leukocytes, UA: NEGATIVE
Protein, ur: 30 mg/dL — AB
pH: 6 (ref 5.0–8.0)

## 2010-07-13 LAB — URINE CULTURE: Special Requests: NEGATIVE

## 2010-07-13 LAB — CULTURE, BLOOD (ROUTINE X 2)

## 2010-07-13 LAB — BLOOD GAS, ARTERIAL
Drawn by: 103701
O2 Content: 2 L/min
O2 Saturation: 91.1 %
Patient temperature: 101.9
pH, Arterial: 7.344 — ABNORMAL LOW (ref 7.350–7.450)
pO2, Arterial: 67.1 mmHg — ABNORMAL LOW (ref 80.0–100.0)

## 2010-07-13 LAB — HEPATIC FUNCTION PANEL
Alkaline Phosphatase: 93 U/L (ref 39–117)
Bilirubin, Direct: 0.6 mg/dL — ABNORMAL HIGH (ref 0.0–0.3)
Indirect Bilirubin: 0.8 mg/dL (ref 0.3–0.9)
Total Bilirubin: 1.4 mg/dL — ABNORMAL HIGH (ref 0.3–1.2)

## 2010-07-13 LAB — MAGNESIUM: Magnesium: 1.5 mg/dL (ref 1.5–2.5)

## 2010-07-13 LAB — HEMOGLOBIN AND HEMATOCRIT, BLOOD: HCT: 27.4 % — ABNORMAL LOW (ref 39.0–52.0)

## 2010-07-13 LAB — URINE MICROSCOPIC-ADD ON

## 2010-08-03 LAB — BASIC METABOLIC PANEL
CO2: 27 mEq/L (ref 19–32)
CO2: 28 mEq/L (ref 19–32)
Calcium: 8.1 mg/dL — ABNORMAL LOW (ref 8.4–10.5)
Calcium: 9.3 mg/dL (ref 8.4–10.5)
Creatinine, Ser: 1.27 mg/dL (ref 0.4–1.5)
GFR calc Af Amer: >60 mL/min (ref >60)
GFR calc Af Amer: >60 mL/min (ref >60)
GFR calc non Af Amer: 55 mL/min — ABNORMAL LOW (ref >60)
Sodium: 138 mEq/L (ref 135–145)

## 2010-08-03 LAB — CBC
Hemoglobin: 11.9 g/dL — ABNORMAL LOW (ref 13.0–17.0)
MCHC: 32.6 g/dL (ref 30.0–36.0)
MCHC: 33.2 g/dL (ref 30.0–36.0)
Platelets: 250 10*3/uL (ref 150–400)
RBC: 4.22 MIL/uL (ref 4.22–5.81)
RBC: 4.84 MIL/uL (ref 4.22–5.81)
WBC: 11.7 10*3/uL — ABNORMAL HIGH (ref 4.0–10.5)

## 2010-08-04 LAB — CBC
HCT: 41.1 % (ref 39.0–52.0)
Hemoglobin: 14.2 g/dL (ref 13.0–17.0)
MCHC: 34.4 g/dL (ref 30.0–36.0)
MCV: 84.5 fL (ref 78.0–100.0)
RBC: 4.87 MIL/uL (ref 4.22–5.81)
WBC: 5.9 10*3/uL (ref 4.0–10.5)

## 2010-08-04 LAB — BASIC METABOLIC PANEL
CO2: 31 mEq/L (ref 19–32)
Chloride: 102 mEq/L (ref 96–112)
GFR calc Af Amer: >60 mL/min (ref >60)
Potassium: 4 mEq/L (ref 3.5–5.1)
Sodium: 140 mEq/L (ref 135–145)

## 2010-09-01 NOTE — Op Note (Signed)
NAME:  HAROUN, COTHAM NO.:  [id number]   MEDICAL RECORD NO.:  [id number]          PATIENT TYPE:  INP   LOCATION:  5149                         FACILITY:  [facility]   PHYSICIAN:  Thornton Park. Daphine Deutscher, MD  DATE OF BIRTH:  1931-08-24   DATE OF PROCEDURE:  DATE OF DISCHARGE:                               OPERATIVE REPORT   PREOPERATIVE DIAGNOSIS:  Cancer of rectosigmoid.   POSTOPERATIVE DIAGNOSIS:  Cancer at 15 cm status post laparoscopically  assisted low anterior resection.   PROCEDURE:  Laparoscopically assisted low anterior resection with EEA  anastomosis.   SURGEON:  Thornton Park. Daphine Deutscher, MD   ASSISTANT:  Currie Paris, MD   ANESTHESIA:  General endotracheal.   DESCRIPTION OF PROCEDURE:  Mr. Majkowski was taken to room 16 on Wednesday,  May 15, 2008, given general anesthesia.  The abdomen was prepped  with chlorhexidine equivalent including the perineum and draped  sterilely.  We entered the abdomen through the right upper quadrant  using a 5-mm OptiVu, 0 degree scope without difficulty insufflating and  surveying the abdomen.  I put in 2 other trocars lower and one in the  midline and one was slightly in the right lower quadrant.  Sigmoid was  found to be stuck anteriorly on the left side of what appeared to be  advanced tumor.  I used a harmonic scalpel to take this down and  realized this was very low.  I then decided to go ahead and make a lower  midline incision using the electro wound protector.  With that in place  and with a very redundant sigmoid, I was then able to divide the colon  proximally and go through the mesentery with a harmonic scalpel  oversewing vessels with 2-0 silks, carrying this down into the pelvis,  and getting below this large tumor.   It was worrisome to me that the tumor was stuck in the anterior wall and  upon resecting this, I went ahead and biopsied that and put a suture of  silk with 2 large Ligaclips on that.  After  mobilization, I went down  below with the contour stapler using the blue cartridge and fired across  that creating a rectal stump.  I then opened the specimen on the back  table and had a centimeter margin distally and opted to get a bigger  margins, so I went ahead and freed up the stump a little more and got  another 2 cm to that.   I then hand sewed a pursestring proximally on the sigmoid colon and used  a 29-Ethicon end to end stapling device.  This was then inserted through  the rectum after I dilated the rectum as it was somewhat tight and came  out just below the staple line and this connection was made and the  stapler fired.  I used the rigid sigmoidoscope and insufflated under  pressure with a clamp.  Prolonged examination did not show any bubbling.   Stool was coming down through the anastomosis or liquid material as I  could see at the completion of the case.  Sponge and needle counts were ultimately reported as correct.  Although,  we had one missing, which was later found up on the drapes.  The wound  was closed with 2-0 Vicryl on the peritoneum and then running #1 PDS  from above and below.  The wound was irrigated and closed with staples.  The patient tolerated the procedure well and was taken to recovery room  in satisfactory condition.      Thornton Park Daphine Deutscher, MD  Electronically Signed     MBM/MEDQ  D:  05/15/2008  T:  05/16/2008  Job:  [id number]   cc:   Tasia Catchings, M.D.  Demetria Pore. Coral Spikes, M.D.
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
## 2010-09-01 NOTE — Discharge Summary (Signed)
NAME:  Maxwell Nguyen, Maxwell Nguyen NO.:  [id number]   MEDICAL RECORD NO.:  [id number]          PATIENT TYPE:  INP   LOCATION:  5149                         FACILITY:  [facility]   PHYSICIAN:  Thornton Park. Daphine Deutscher, MD  DATE OF BIRTH:  Nov 12, 1931   DATE OF ADMISSION:  05/15/2008  DATE OF DISCHARGE:  05/21/2008                               DISCHARGE SUMMARY   ADMITTING DIAGNOSIS:  Rectosigmoid colon cancer.   DISCHARGE DIAGNOSES:  Rectosigmoid colon cancer, T4 N1 Mx.   PROCEDURE:  On May 15, 2008, laparoscopically assisted low-anterior  resection of the rectosigmoid.  Findings, tumor stuck to the anterior  pelvic sidewall with 2 clips placed at an area of pathologically  desmoplastic reaction.   COURSE IN THE HOSPITAL:  Maxwell Nguyen underwent the above-mentioned  operation on January 27.  Postop day #1, he did well.  He was on Adolor  morphine mu receptor antagonist.  He began passing flatus on postop day  #2.  We started him on some liquids.  He got along well and diet  advanced to regular diet and he is ready for discharge.  He had no flare  ups of his gout and he was taken off his prednisone.  He was told we  will be making an appointment with Medical Oncology for couple of weeks  post discharge and I will see him at the same time.  I gave him a  prescription for Vicodin and has asked that his incision staples be  removed prior to discharge.   DISCHARGE MEDICATIONS:  Aspirin 81 mg daily and the other medicine  except prednisone.  That will include simvastatin, hydrochlorothiazide,  atenolol, and Altace.   I will note that the patient did receive Mefoxin perioperatively and had  no evidence of a penicillin rash or allergy to that.   The patient was doing well, condition good, ready for discharge,  May 21, 2008.       Thornton Park Daphine Deutscher, MD  Electronically Signed     MBM/MEDQ  D:  05/21/2008  T:  05/21/2008  Job:  [id number]   cc:   Demetria Pore. Coral Spikes, M.D.  Tasia Catchings, M.D.  Medical Oncology

## 2010-12-04 IMAGING — CT CT PELVIS W/ CM
2 of 5 series · 17 of 46 positions shown, 19 images · IV contrast ([ID] [id number])
Comparison: None

CT ABDOMEN

CLINICAL DATA: New diagnosis of sigmoid colon cancer.  Prostate
cancer.

CT ABDOMEN AND PELVIS WITH CONTRAST
TECHNIQUE: Multidetector CT imaging of the abdomen and pelvis was
performed using the standard protocol following bolus
administration of intravenous contrast.
Contrast: 125 ml Cmnipaque-0CC

[Series 3: routine abdomen · axial · 0.79mm/px · z∈[-454,-20]mm · 14 of 99 slices shown, 16 images]
[im 6/99  soft-tissue]
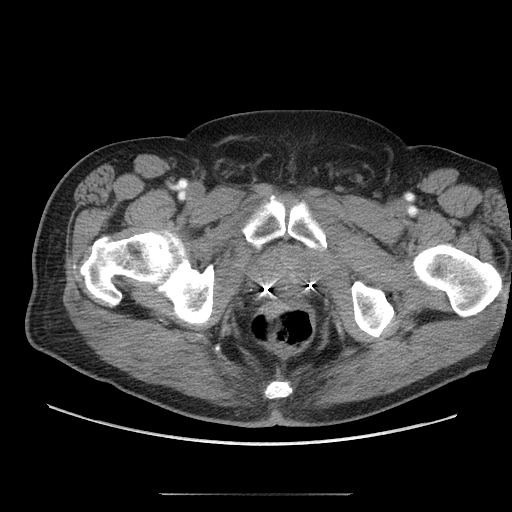
[im 6/99  bone]
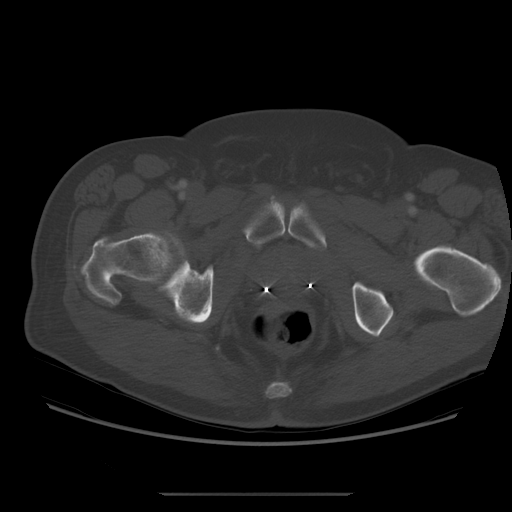
[im 11/99  soft-tissue]
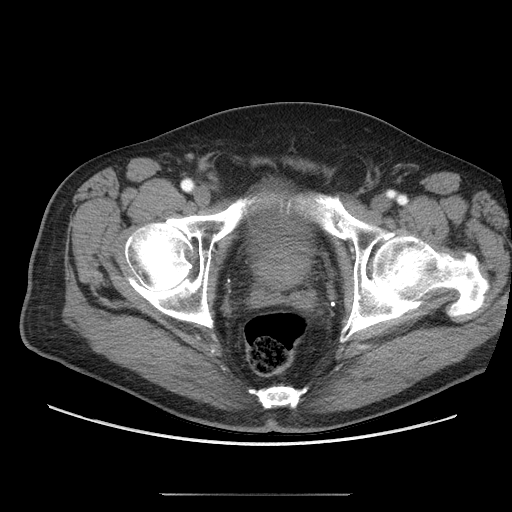
[im 21/99  soft-tissue]
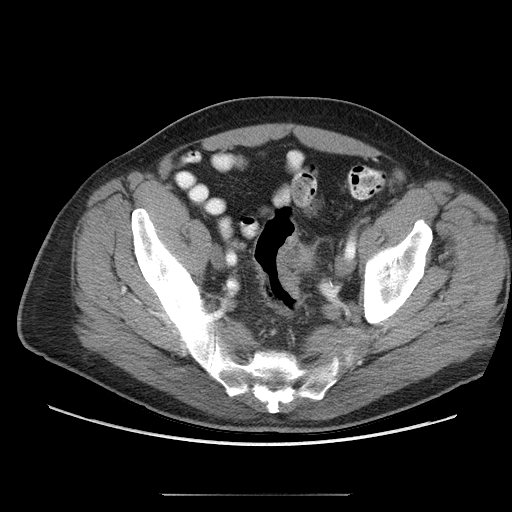
[im 26/99  soft-tissue]
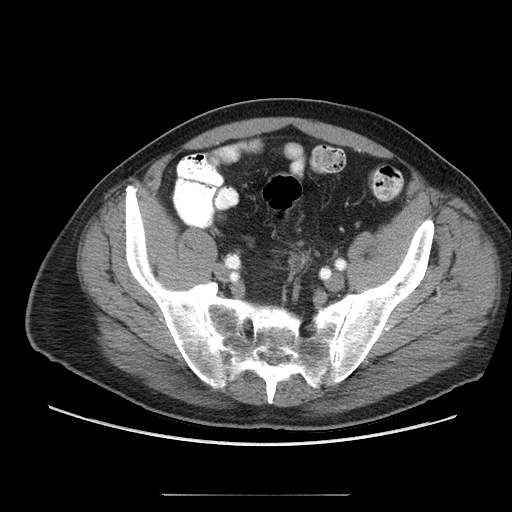
[im 31/99  soft-tissue]
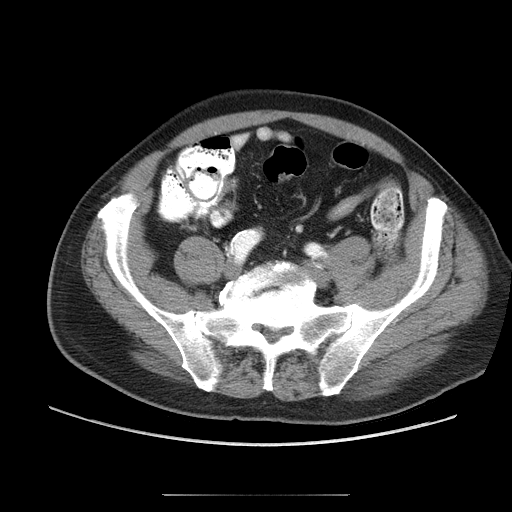
[im 42/99  soft-tissue]
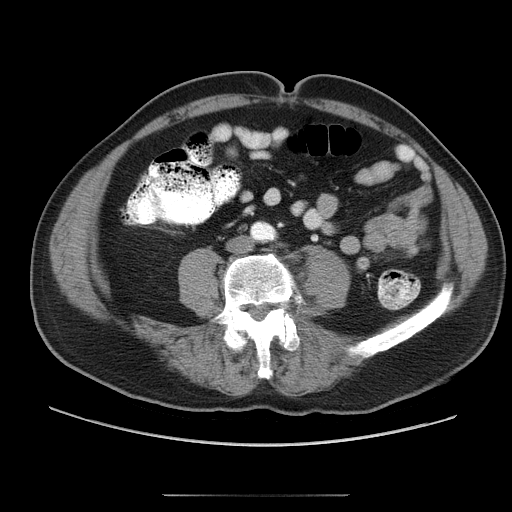
[im 47/99  soft-tissue]
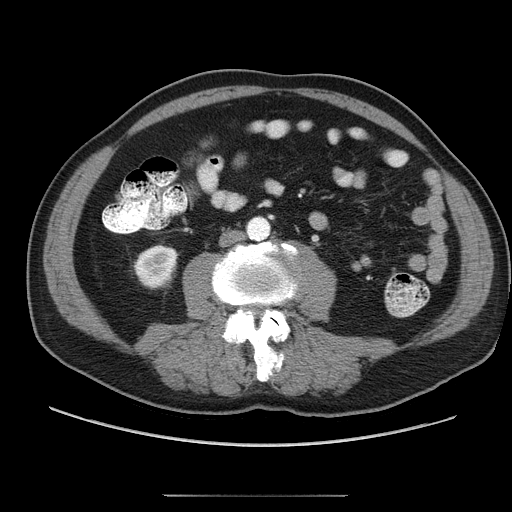
[im 52/99  soft-tissue]
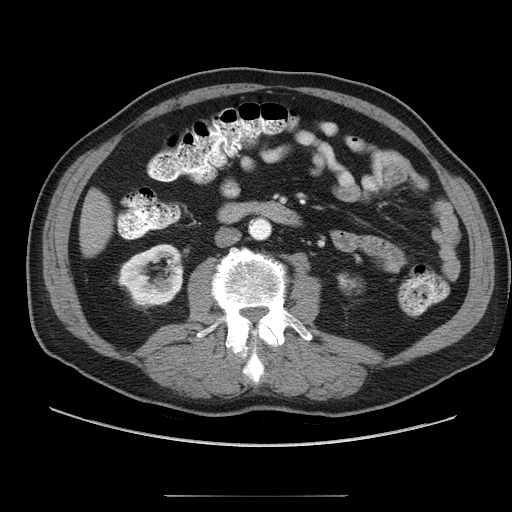
[im 57/99  soft-tissue]
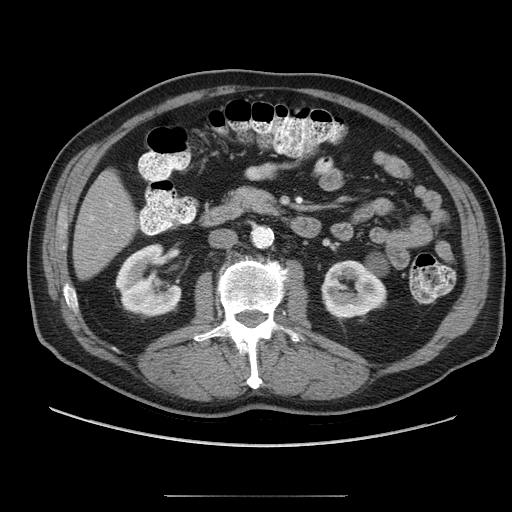
[im 57/99  bone]
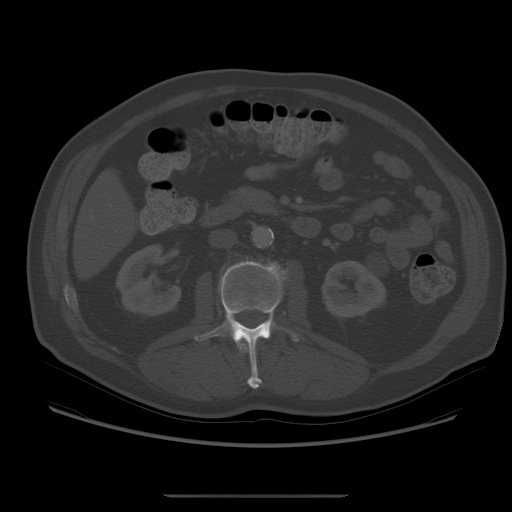
[im 68/99  soft-tissue]
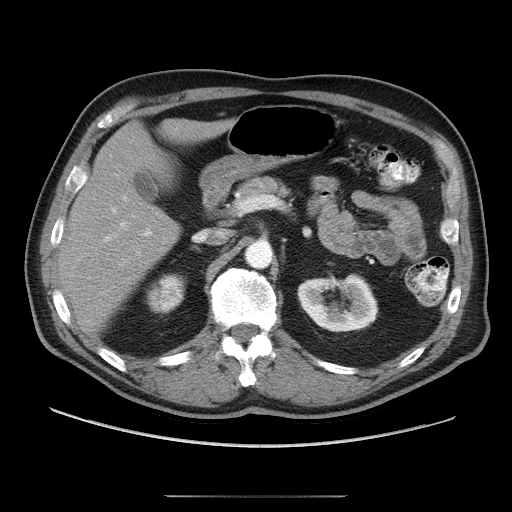
[im 73/99  soft-tissue]
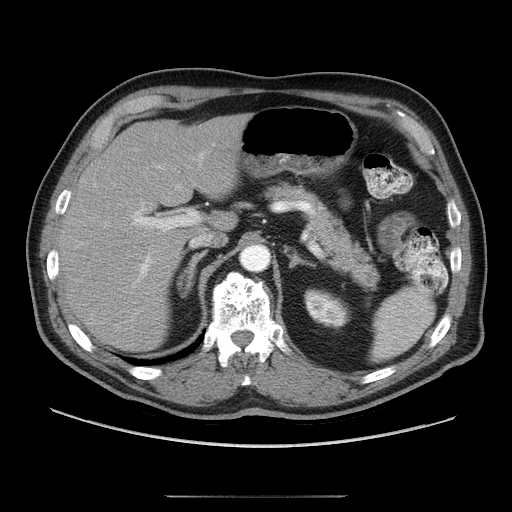
[im 78/99  soft-tissue]
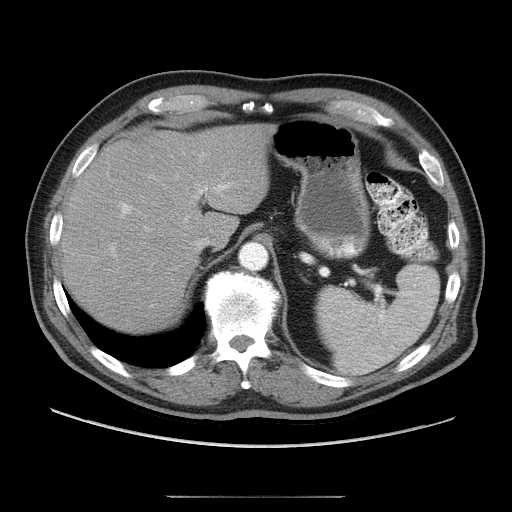
[im 88/99  soft-tissue]
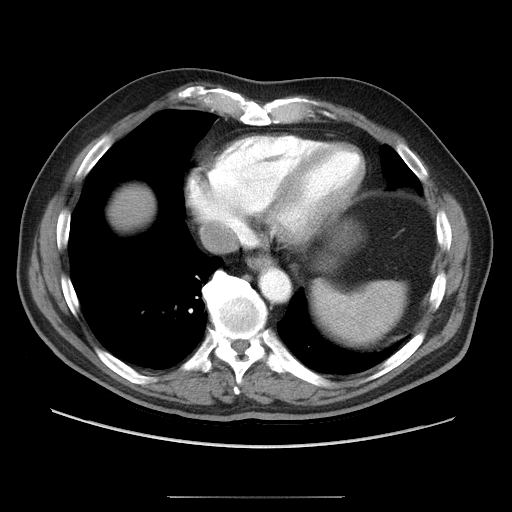
[im 93/99  soft-tissue]
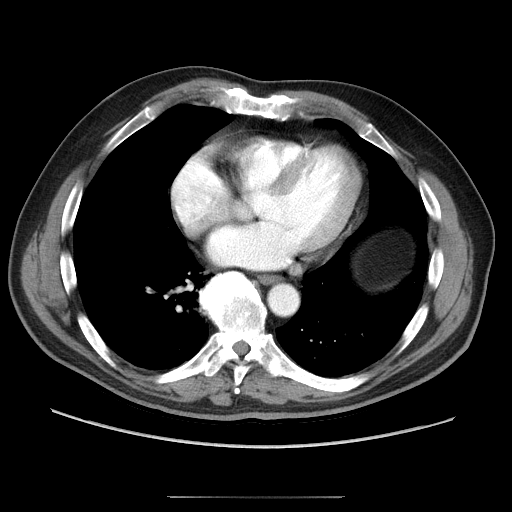

[Series 602: sagittal body · sagittal · 0.99mm/px · 3 of 162 slices shown]
[im 54/162  soft-tissue]
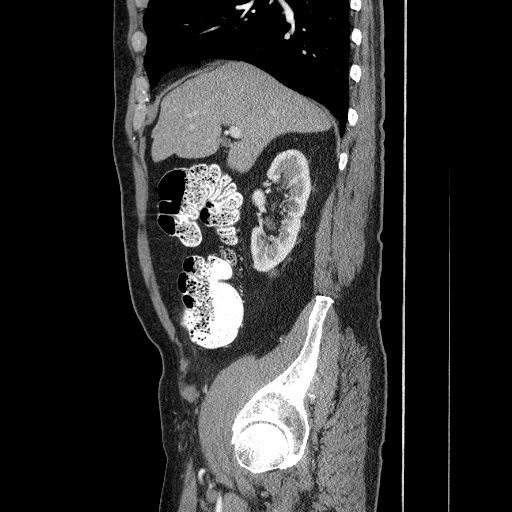
[im 72/162  soft-tissue]
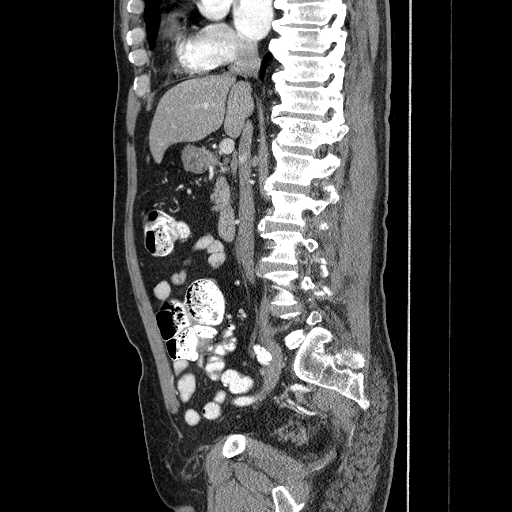
[im 90/162  soft-tissue]
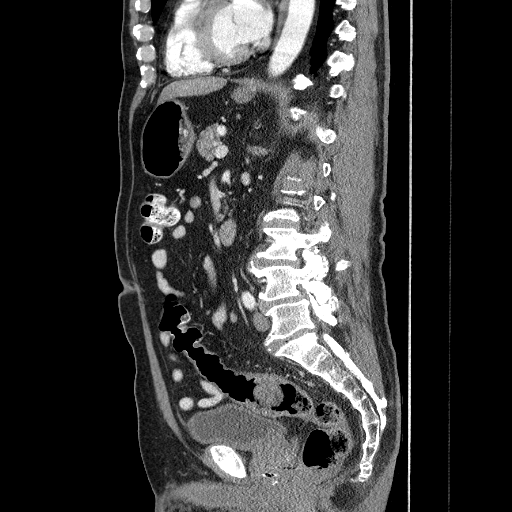

[17 of 46 positions shown; findings below may reference images not displayed]

FINDINGS: The lung bases show mild dependent atelectasis and/or
scarring.  Heart is mildly enlarged.  No pericardial or pleural
effusion.

Liver, gallbladder and adrenal glands are unremarkable.  There is
scattered renal cortical scarring.  Low attenuation lesions measure
up to 2.7 cm on the left, and may represent a combination of simple
and mildly complex cysts.  Spleen, pancreas, stomach and small
bowel are unremarkable.  No pathologically enlarged lymph nodes.
Atherosclerotic calcification of the arterial vasculature.
IMPRESSION: No evidence of metastatic disease in the abdomen.

CT PELVIS
FINDINGS: There is thickening of the sigmoid colon, with
pericolonic inflammatory changes and adjacent adenopathy, measuring
up to 1.6 x 2.2 cm.  Remainder of the colon is unremarkable.  Small
bilateral inguinal hernias contain fat.  Brachytherapy seeds are
seen in the prostate.  No worrisome lytic or sclerotic lesions.
Severe degenerative changes are seen in the spine.  Grade 1
anterolisthesis is seen at L4-5.  There may be an atypical
hemangioma in the T12 vertebral body.
IMPRESSION: 1.  Sigmoid colon carcinoma with adjacent adenopathy.
2.  Small bilateral inguinal hernias contain fat.
